# Patient Record
Sex: Male | Born: 1980 | Race: Black or African American | Hispanic: No | Marital: Single | Smoking: Former smoker
Health system: Southern US, Community
[De-identification: ages and names within clinical notes are randomized; demographics above are authoritative.]

## PROBLEM LIST (undated history)

## (undated) HISTORY — PX: NO PAST SURGERIES: SHX2092

---

## 2005-03-06 ENCOUNTER — Emergency Department: Payer: Self-pay | Admitting: Emergency Medicine

## 2010-04-05 ENCOUNTER — Emergency Department: Payer: Self-pay | Admitting: Emergency Medicine

## 2013-02-12 ENCOUNTER — Emergency Department: Payer: Self-pay | Admitting: Emergency Medicine

## 2013-02-12 LAB — BASIC METABOLIC PANEL
BUN: 10 mg/dL (ref 7–18)
Calcium, Total: 8.9 mg/dL (ref 8.5–10.1)
Chloride: 100 mmol/L (ref 98–107)
Co2: 30 mmol/L (ref 21–32)
EGFR (African American): >60

## 2013-02-12 LAB — CBC
HGB: 15.5 g/dL (ref 13.0–18.0)
MCH: 31.1 pg (ref 26.0–34.0)
MCHC: 34.2 g/dL (ref 32.0–36.0)
MCV: 91 fL (ref 80–100)
Platelet: 234 10*3/uL (ref 150–440)
RBC: 4.99 10*6/uL (ref 4.40–5.90)

## 2013-02-12 LAB — TROPONIN I: Troponin-I: <0.02 ng/mL

## 2015-05-14 ENCOUNTER — Encounter: Payer: Self-pay | Admitting: Medical Oncology

## 2015-05-14 ENCOUNTER — Emergency Department
Admission: EM | Admit: 2015-05-14 | Discharge: 2015-05-14 | Disposition: A | Discharge status: Home / Self Care | Payer: Medicaid Other | Attending: Emergency Medicine | Admitting: Emergency Medicine

## 2015-05-14 ENCOUNTER — Emergency Department: Payer: Medicaid Other

## 2015-05-14 DIAGNOSIS — S8992XA Unspecified injury of left lower leg, initial encounter: Secondary | ICD-10-CM | POA: Diagnosis present

## 2015-05-14 DIAGNOSIS — S8002XA Contusion of left knee, initial encounter: Secondary | ICD-10-CM | POA: Diagnosis not present

## 2015-05-14 DIAGNOSIS — Y9389 Activity, other specified: Secondary | ICD-10-CM | POA: Insufficient documentation

## 2015-05-14 DIAGNOSIS — Y92410 Unspecified street and highway as the place of occurrence of the external cause: Secondary | ICD-10-CM | POA: Insufficient documentation

## 2015-05-14 DIAGNOSIS — Y998 Other external cause status: Secondary | ICD-10-CM | POA: Diagnosis not present

## 2015-05-14 DIAGNOSIS — S233XXA Sprain of ligaments of thoracic spine, initial encounter: Secondary | ICD-10-CM

## 2015-05-14 DIAGNOSIS — S239XXA Sprain of unspecified parts of thorax, initial encounter: Secondary | ICD-10-CM | POA: Diagnosis not present

## 2015-05-14 DIAGNOSIS — S29012A Strain of muscle and tendon of back wall of thorax, initial encounter: Secondary | ICD-10-CM | POA: Diagnosis not present

## 2015-05-14 DIAGNOSIS — S39012A Strain of muscle, fascia and tendon of lower back, initial encounter: Secondary | ICD-10-CM | POA: Insufficient documentation

## 2015-05-14 MED ORDER — ORPHENADRINE CITRATE 30 MG/ML IJ SOLN: 60.0000 mg | Freq: Two times a day (BID) | INTRAMUSCULAR | Status: DC

## 2015-05-14 MED ORDER — CYCLOBENZAPRINE HCL 10 MG PO TABS: 10.0000 mg | ORAL_TABLET | Freq: Three times a day (TID) | ORAL | Status: DC | PRN

## 2015-05-14 MED ORDER — KETOROLAC TROMETHAMINE 60 MG/2ML IM SOLN: 60.0000 mg | Freq: Once | INTRAMUSCULAR | Status: DC

## 2015-05-14 MED ORDER — IBUPROFEN 800 MG PO TABS: 800.0000 mg | ORAL_TABLET | Freq: Three times a day (TID) | ORAL | Status: DC | PRN
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
## 2016-04-03 ENCOUNTER — Emergency Department
Admission: EM | Admit: 2016-04-03 | Discharge: 2016-04-03 | Disposition: A | Discharge status: Home / Self Care | Payer: Medicaid Other | Attending: Emergency Medicine | Admitting: Emergency Medicine

## 2016-04-03 ENCOUNTER — Encounter: Payer: Self-pay | Admitting: Emergency Medicine

## 2016-04-03 ENCOUNTER — Emergency Department: Payer: Medicaid Other

## 2016-04-03 DIAGNOSIS — S6992XA Unspecified injury of left wrist, hand and finger(s), initial encounter: Secondary | ICD-10-CM | POA: Diagnosis present

## 2016-04-03 DIAGNOSIS — Y929 Unspecified place or not applicable: Secondary | ICD-10-CM | POA: Diagnosis not present

## 2016-04-03 DIAGNOSIS — W2105XA Struck by basketball, initial encounter: Secondary | ICD-10-CM | POA: Insufficient documentation

## 2016-04-03 DIAGNOSIS — J069 Acute upper respiratory infection, unspecified: Secondary | ICD-10-CM | POA: Diagnosis not present

## 2016-04-03 DIAGNOSIS — Y999 Unspecified external cause status: Secondary | ICD-10-CM | POA: Diagnosis not present

## 2016-04-03 DIAGNOSIS — Y9367 Activity, basketball: Secondary | ICD-10-CM | POA: Diagnosis not present

## 2016-04-03 DIAGNOSIS — S62609A Fracture of unspecified phalanx of unspecified finger, initial encounter for closed fracture: Secondary | ICD-10-CM

## 2016-04-03 DIAGNOSIS — S62637A Displaced fracture of distal phalanx of left little finger, initial encounter for closed fracture: Secondary | ICD-10-CM | POA: Insufficient documentation

## 2016-04-03 MED ORDER — GUAIFENESIN-CODEINE 100-10 MG/5ML PO SOLN
10.0000 mL | ORAL | Status: DC | PRN
Start: 2016-04-03 — End: 2019-04-05

## 2016-04-03 MED ORDER — IBUPROFEN 800 MG PO TABS: 800.0000 mg | ORAL_TABLET | Freq: Three times a day (TID) | ORAL | Status: DC | PRN

## 2016-04-03 MED ORDER — AZITHROMYCIN 250 MG PO TABS: ORAL_TABLET | ORAL | Status: DC

## 2016-04-03 MED ORDER — HYDROCODONE-ACETAMINOPHEN 5-325 MG PO TABS: 1.0000 | ORAL_TABLET | ORAL | Status: DC | PRN

## 2016-04-03 NOTE — ED Provider Notes (Signed)
[facility] Emergency Department Provider Note  ____________________________________________  Time seen: Approximately 7:13 AM  I have reviewed the triage vital signs and the nursing notes.   HISTORY  Chief Complaint Cough; Chills; and Hand Pain    HPI Wayne Sims is a 35 y.o. male presents for evaluation of cough 1 week and chills at nighttime. Patient additionally reports complaining of injuring his left finger about 2 weeks ago presents with deformity of the left finger. She reports playing basketball when he got injured with the finger.   History reviewed. No pertinent past medical history.  There are no active problems to display for this patient.   History reviewed. No pertinent past surgical history.  Current Outpatient Rx  Name  Route  Sig  Dispense  Refill  . azithromycin (ZITHROMAX Z-PAK) 250 MG tablet      Take 2 tablets (500 mg) on  Day 1,  followed by 1 tablet (250 mg) once daily on Days 2 through 5.   6 each   0   . cyclobenzaprine (FLEXERIL) 10 MG tablet   Oral   Take 1 tablet (10 mg total) by mouth every 8 (eight) hours as needed for muscle spasms (PRN pain. Do not drive or operate heavy machinery while taking as can cause drowsiness.).   12 tablet   0   . guaiFENesin-codeine 100-10 MG/5ML syrup   Oral   Take 10 mLs by mouth every 4 (four) hours as needed for cough.   180 mL   0   . HYDROcodone-acetaminophen (NORCO) 5-325 MG tablet   Oral   Take 1-2 tablets by mouth every 4 (four) hours as needed for moderate pain.   15 tablet   0   . ibuprofen (ADVIL,MOTRIN) 800 MG tablet   Oral   Take 1 tablet (800 mg total) by mouth every 8 (eight) hours as needed.   30 tablet   0     Allergies Review of patient's allergies indicates no known allergies.  No family history on file.  Social History Social History  Substance Use Topics  . Smoking status: Never Smoker   . Smokeless tobacco: None  . Alcohol Use: No     Review of Systems Constitutional: Positive fever/chills Eyes: No visual changes. ENT: Positive sore throat. Cardiovascular: Denies chest pain. Respiratory: Denies shortness of breath. Positive for cough and Gastrointestinal: No abdominal pain.  No nausea, no vomiting.  No diarrhea.  No constipation. Genitourinary: Negative for dysuria. Musculoskeletal: Positive for left fifth finger pain. Skin: Negative for rash. Neurological: Positive for headaches, negative for focal weakness or numbness.  10-point ROS otherwise negative.  ____________________________________________   PHYSICAL EXAM:  VITAL SIGNS: ED Triage Vitals  Enc Vitals Group     BP 04/03/16 0246 125/87 mmHg     Pulse Rate 04/03/16 0246 87     Resp 04/03/16 0246 20     Temp 04/03/16 0246 99.1 F (37.3 C)     Temp Source 04/03/16 0246 Oral     SpO2 04/03/16 0246 97 %     Weight 04/03/16 0246 292 lb (132.45 kg)     Height 04/03/16 0246  (1.905 m)     Head Cir --      Peak Flow --      Pain Score 04/03/16 0247 9     Pain Loc --      Pain Edu? --      Excl. in GC? --     Constitutional: Alert  and oriented. Well appearing and in no acute distress. Eyes: Conjunctivae are normal. PERRL. EOMI. Head: Atraumatic. Nose: Minimal congestion/rhinnorhea. Mouth/Throat: Mucous membranes are moist.  Oropharynx non-erythematous. Neck: No stridor. Full range of motion nontender  Cardiovascular: Normal rate, regular rhythm. Grossly normal heart sounds.  Good peripheral circulation. Respiratory: Normal respiratory effort.  No retractions. Lungs CTAB. Gastrointestinal: Soft and nontender. No distention. No abdominal bruits. No CVA tenderness. Musculoskeletal: Left finger with distal deformity noted point tenderness as well to PIP joint Neurologic:  Normal speech and language. No gross focal neurologic deficits are appreciated. No gait instability. Skin:  Skin is warm, dry and intact. No rash noted. Psychiatric: Mood and  affect are normal. Speech and behavior are normal.  ____________________________________________   LABS (all labs ordered are listed, but only abnormal results are displayed)  Labs Reviewed - No data to display ____________________________________________  EKG   ____________________________________________  RADIOLOGY  FINDINGS: There is a dorsal plate fracture of the distal phalanx of the left fifth finger with posterior displacement of the dorsal plate fracture fragment and mild radial angulation of the distal fracture fragment. Proximal and medial phalanges appear intact. Soft tissues are unremarkable.  IMPRESSION: Displaced dorsal plate fracture of the distal phalanx of the left fifth finger ____________________________________________   PROCEDURES  Procedure(s) performed: None  Critical Care performed: No  ____________________________________________   INITIAL IMPRESSION / ASSESSMENT AND PLAN / ED COURSE  Pertinent labs & imaging results that were available during my care of the patient were reviewed by me and considered in my medical decision making (see chart for details).  Acute upper respiratory infection and left fifth finger fracture with mild displacement. 3 splint applied patient was discharged home with prescription for Zithromax, Robitussin-AC, Vicodin and Motrin 800 mg. Patient is a follow-up with orthopedics on-call and return to the ER with any worsening symptomology for his no other emergency medical complaints at this time. ____________________________________________   FINAL CLINICAL IMPRESSION(S) / ED DIAGNOSES  Final diagnoses:  URI, acute  Finger fracture, closed, initial encounter     This chart was dictated using voice recognition software/Dragon. Despite best efforts to proofread, errors can occur which can change the meaning. Any change was purely unintentional.   Evangeline Dakinharles M Jaron Czarnecki, PA-C 04/03/16 0802  Arnaldo NatalPaul F Malinda,  MD 04/03/16 1110

## 2016-04-03 NOTE — ED Notes (Signed)
Patient with complaint of cough times one week and chills at night. Patient states that he also injured his left fifth finger about 2 weeks ago. Patient with deformity and pain to finger.

## 2016-04-03 NOTE — Discharge Instructions (Signed)
Finger Fracture Fractures of fingers are breaks in the bones of the fingers. There are many types of fractures. There are different ways of treating these fractures. Your health care provider will discuss the best way to treat your fracture. CAUSES Traumatic injury is the main cause of broken fingers. These include:  Injuries while playing sports.  Workplace injuries.  Falls. RISK FACTORS Activities that can increase your risk of finger fractures include:  Sports.  Workplace activities that involve machinery.  A condition called osteoporosis, which can make your bones less dense and cause them to fracture more easily. SIGNS AND SYMPTOMS The main symptoms of a broken finger are pain and swelling within 15 minutes after the injury. Other symptoms include:  Bruising of your finger.  Stiffness of your finger.  Numbness of your finger.  Exposed bones (compound fracture) if the fracture is severe. DIAGNOSIS  The best way to diagnose a broken bone is with X-ray imaging. Additionally, your health care provider will use this X-ray image to evaluate the position of the broken finger bones.  TREATMENT  Finger fractures can be treated with:   Nonreduction--This means the bones are in place. The finger is splinted without changing the positions of the bone pieces. The splint is usually left on for about a week to 10 days. This will depend on your fracture and what your health care provider thinks.  Closed reduction--The bones are put back into position without using surgery. The finger is then splinted.  Open reduction and internal fixation--The fracture site is opened. Then the bone pieces are fixed into place with pins or some type of hardware. This is seldom required. It depends on the severity of the fracture. HOME CARE INSTRUCTIONS   Follow your health care provider's instructions regarding activities, exercises, and physical therapy.  Only take over-the-counter or prescription  medicines for pain, discomfort, or fever as directed by your health care provider. SEEK MEDICAL CARE IF: You have pain or swelling that limits the motion or use of your fingers. SEEK IMMEDIATE MEDICAL CARE IF:  Your finger becomes numb. MAKE SURE YOU:   Understand these instructions.  Will watch your condition.  Will get help right away if you are not doing well or get worse.   This information is not intended to replace advice given to you by your health care provider. Make sure you discuss any questions you have with your health care provider.   Document Released: 02/13/2001 Document Revised: 08/22/2013 Document Reviewed: 06/13/2013 Elsevier Interactive Patient Education 2016 [employer]  Upper Respiratory Infection, Adult Most upper respiratory infections (URIs) are a viral infection of the air passages leading to the lungs. A URI affects the nose, throat, and upper air passages. The most common type of URI is nasopharyngitis and is typically referred to as "the common cold." URIs run their course and usually go away on their own. Most of the time, a URI does not require medical attention, but sometimes a bacterial infection in the upper airways can follow a viral infection. This is called a secondary infection. Sinus and middle ear infections are common types of secondary upper respiratory infections. Bacterial pneumonia can also complicate a URI. A URI can worsen asthma and chronic obstructive pulmonary disease (COPD). Sometimes, these complications can require emergency medical care and may be life threatening.  CAUSES Almost all URIs are caused by viruses. A virus is a type of germ and can spread from one person to another.  RISKS FACTORS You may be at  risk for a URI if:   You smoke.   You have chronic heart or lung disease.  You have a weakened defense (immune) system.   You are very young or very old.   You have nasal allergies or asthma.  You work in crowded or  poorly ventilated areas.  You work in health care facilities or schools. SIGNS AND SYMPTOMS  Symptoms typically develop 2-3 days after you come in contact with a cold virus. Most viral URIs last 7-10 days. However, viral URIs from the influenza virus (flu virus) can last 14-18 days and are typically more severe. Symptoms may include:   Runny or stuffy (congested) nose.   Sneezing.   Cough.   Sore throat.   Headache.   Fatigue.   Fever.   Loss of appetite.   Pain in your forehead, behind your eyes, and over your cheekbones (sinus pain).  Muscle aches.  DIAGNOSIS  Your health care provider may diagnose a URI by:  Physical exam.  Tests to check that your symptoms are not due to another condition such as:  Strep throat.  Sinusitis.  Pneumonia.  Asthma. TREATMENT  A URI goes away on its own with time. It cannot be cured with medicines, but medicines may be prescribed or recommended to relieve symptoms. Medicines may help:  Reduce your fever.  Reduce your cough.  Relieve nasal congestion. HOME CARE INSTRUCTIONS   Take medicines only as directed by your health care provider.   Gargle warm saltwater or take cough drops to comfort your throat as directed by your health care provider.  Use a warm mist humidifier or inhale steam from a shower to increase air moisture. This may make it easier to breathe.  Drink enough fluid to keep your urine clear or pale yellow.   Eat soups and other clear broths and maintain good nutrition.   Rest as needed.   Return to work when your temperature has returned to normal or as your health care provider advises. You may need to stay home longer to avoid infecting others. You can also use a face mask and careful hand washing to prevent spread of the virus.  Increase the usage of your inhaler if you have asthma.   Do not use any tobacco products, including cigarettes, chewing tobacco, or electronic cigarettes. If you  need help quitting, ask your health care provider. PREVENTION  The best way to protect yourself from getting a cold is to practice good hygiene.   Avoid oral or hand contact with people with cold symptoms.   Wash your hands often if contact occurs.  There is no clear evidence that vitamin C, vitamin E, echinacea, or exercise reduces the chance of developing a cold. However, it is always recommended to get plenty of rest, exercise, and practice good nutrition.  SEEK MEDICAL CARE IF:   You are getting worse rather than better.   Your symptoms are not controlled by medicine.   You have chills.  You have worsening shortness of breath.  You have brown or red mucus.  You have yellow or brown nasal discharge.  You have pain in your face, especially when you bend forward.  You have a fever.  You have swollen neck glands.  You have pain while swallowing.  You have white areas in the back of your throat. SEEK IMMEDIATE MEDICAL CARE IF:   You have severe or persistent:  Headache.  Ear pain.  Sinus pain.  Chest pain.  You have chronic lung  disease and any of the following:  Wheezing.  Prolonged cough.  Coughing up blood.  A change in your usual mucus.  You have a stiff neck.  You have changes in your:  Vision.  Hearing.  Thinking.  Mood. MAKE SURE YOU:   Understand these instructions.  Will watch your condition.  Will get help right away if you are not doing well or get worse.   This information is not intended to replace advice given to you by your health care provider. Make sure you discuss any questions you have with your health care provider.   Document Released: 04/27/2001 Document Revised: 03/18/2015 Document Reviewed: 02/06/2014 Elsevier Interactive Patient Education [employer].

## 2017-02-25 ENCOUNTER — Emergency Department
Admission: EM | Admit: 2017-02-25 | Discharge: 2017-02-25 | Disposition: A | Discharge status: Home / Self Care | Payer: Medicaid Other | Attending: Emergency Medicine | Admitting: Emergency Medicine

## 2017-02-25 ENCOUNTER — Encounter: Payer: Self-pay | Admitting: Medical Oncology

## 2017-02-25 DIAGNOSIS — Z79899 Other long term (current) drug therapy: Secondary | ICD-10-CM | POA: Insufficient documentation

## 2017-02-25 DIAGNOSIS — J020 Streptococcal pharyngitis: Secondary | ICD-10-CM | POA: Insufficient documentation

## 2017-02-25 DIAGNOSIS — J029 Acute pharyngitis, unspecified: Secondary | ICD-10-CM | POA: Diagnosis present

## 2017-02-25 DIAGNOSIS — R509 Fever, unspecified: Secondary | ICD-10-CM

## 2017-02-25 LAB — POCT RAPID STREP A: STREPTOCOCCUS, GROUP A SCREEN (DIRECT): NEGATIVE

## 2017-02-25 MED ORDER — IBUPROFEN 800 MG PO TABS
800.0000 mg | ORAL_TABLET | Freq: Once | ORAL | Status: AC
  Administered 2017-02-25: 800 mg via ORAL
  Filled 2017-02-25: qty 1

## 2017-02-25 MED ORDER — AMOXICILLIN 875 MG PO TABS: 875.0000 mg | ORAL_TABLET | Freq: Two times a day (BID) | ORAL | 0 refills | Status: DC

## 2017-02-25 NOTE — ED Triage Notes (Signed)
Pt reports sore throat for a few days with low grade fever.

## 2017-02-25 NOTE — Discharge Instructions (Signed)
Please make sure your taking lots of fluids. Take Tylenol and ibuprofen as needed for pain and fevers. Return to the ER for any difficulty swallowing, there is a 102 that are not going down Tylenol and ibuprofen. Please antibiotics.

## 2017-02-25 NOTE — ED Provider Notes (Signed)
[facility]-EMERGENCY DEPARTMENT Provider Note   CSN: [id number] Arrival date & time: 02/25/17  1754     History   Chief Complaint Chief Complaint  Patient presents with  . Sore Throat    HPI Wayne Sims is a 36 y.o. male presents to the emergency department for evaluation of sore throat. Sore throat has been present for 3 days. Sudden onset of pain. He has pain with swallowing. Denies any cough congestion or runny nose. He has had a low-grade fever. Tolerating fluids well. He is not any Tylenol or ibuprofen for pain. Pain is moderate. He denies any rashes, abdominal pain, vomiting.  HPI  History reviewed. No pertinent past medical history.  There are no active problems to display for this patient.   History reviewed. No pertinent surgical history.     Home Medications    Prior to Admission medications   Medication Sig Start Date End Date Taking? Authorizing Provider  amoxicillin (AMOXIL) 875 MG tablet Take 1 tablet (875 mg total) by mouth 2 (two) times daily. X 10 days 02/25/17   Evon Slack, PA-C  azithromycin (ZITHROMAX Z-PAK) 250 MG tablet Take 2 tablets (500 mg) on  Day 1,  followed by 1 tablet (250 mg) once daily on Days 2 through 5. 04/03/16   Evangeline Dakin, PA-C  cyclobenzaprine (FLEXERIL) 10 MG tablet Take 1 tablet (10 mg total) by mouth every 8 (eight) hours as needed for muscle spasms (PRN pain. Do not drive or operate heavy machinery while taking as can cause drowsiness.). 05/14/15   Renford Dills, NP  guaiFENesin-codeine 100-10 MG/5ML syrup Take 10 mLs by mouth every 4 (four) hours as needed for cough. 04/03/16   Evangeline Dakin, PA-C  HYDROcodone-acetaminophen (NORCO) 5-325 MG tablet Take 1-2 tablets by mouth every 4 (four) hours as needed for moderate pain. 04/03/16   Charmayne Sheer Beers, PA-C  ibuprofen (ADVIL,MOTRIN) 800 MG tablet Take 1 tablet (800 mg total) by mouth every 8 (eight) hours as needed. 04/03/16   Evangeline Dakin, PA-C    Family History No family  history on file.  Social History Social History  Substance Use Topics  . Smoking status: Never Smoker  . Smokeless tobacco: Not on file  . Alcohol use No     Allergies   Patient has no known allergies.   Review of Systems Review of Systems  Constitutional: Positive for fever. Negative for activity change, appetite change and chills.  HENT: Positive for sore throat. Negative for congestion, ear pain, mouth sores, rhinorrhea, sinus pressure and trouble swallowing.   Eyes: Negative for photophobia, pain and discharge.  Respiratory: Negative for cough, chest tightness and shortness of breath.   Cardiovascular: Negative for chest pain and leg swelling.  Gastrointestinal: Negative for abdominal distention, abdominal pain, diarrhea, nausea and vomiting.  Genitourinary: Negative for difficulty urinating and dysuria.  Musculoskeletal: Negative for arthralgias, back pain and gait problem.  Skin: Negative for color change and rash.  Neurological: Negative for dizziness and headaches.  Hematological: Negative for adenopathy.  Psychiatric/Behavioral: Negative for agitation and behavioral problems.     Physical Exam Updated Vital Signs BP 132/83 (BP Location: Right Arm)   Pulse 84   Temp 99.1 F (37.3 C) (Oral)   Resp 17   Ht  (1.93 m)   Wt 133.8 kg   SpO2 98%   BMI 35.91 kg/m   Physical Exam  Constitutional: He is oriented to person, place, and time. He appears well-developed and well-nourished.  HENT:  Head: Normocephalic and atraumatic.  Left Ear: External ear normal.  Mouth/Throat: No trismus in the jaw. No uvula swelling. Oropharyngeal exudate and posterior oropharyngeal erythema present. No posterior oropharyngeal edema or tonsillar abscesses.  Eyes: Conjunctivae and EOM are normal. Pupils are equal, round, and reactive to light.  Neck: Normal range of motion. Neck supple.  Cardiovascular: Normal rate, regular rhythm, normal heart sounds and intact distal pulses.     Pulmonary/Chest: Effort normal and breath sounds normal. No respiratory distress. He has no wheezes. He has no rales. He exhibits no tenderness.  Abdominal: Soft. Bowel sounds are normal. He exhibits no distension and no mass. There is no tenderness. There is no guarding.  Musculoskeletal: Normal range of motion. He exhibits no edema or tenderness.  Lymphadenopathy:    He has cervical adenopathy (tender anterior cervical).  Neurological: He is alert and oriented to person, place, and time.  Skin: Skin is warm and dry.  Psychiatric: He has a normal mood and affect. His behavior is normal. Judgment and thought content normal.     ED Treatments / Results  Labs (all labs ordered are listed, but only abnormal results are displayed) Labs Reviewed  POCT RAPID STREP A    EKG  EKG Interpretation None       Radiology No results found.  Procedures Procedures (including critical care time)  Medications Ordered in ED Medications  ibuprofen (ADVIL,MOTRIN) tablet 800 mg (not administered)     Initial Impression / Assessment and Plan / ED Course  I have reviewed the triage vital signs and the nursing notes.  Pertinent labs & imaging results that were available during my care of the patient were reviewed by me and considered in my medical decision making (see chart for details).     36 year old male with moderate sore throat, low-grade fever. No cough. Tender anterior cervical lymphadenopathy. On exam he has exudates and erythema with no signs of peritonsillar abscess. Patient is started on amoxicillin. He will increase fluids. He is educated on signs and symptoms return to the ED for.  Final Clinical Impressions(s) / ED Diagnoses   Final diagnoses:  Acute streptococcal pharyngitis  Fever, unspecified fever cause    New Prescriptions New Prescriptions   AMOXICILLIN (AMOXIL) 875 MG TABLET    Take 1 tablet (875 mg total) by mouth 2 (two) times daily. X 10 days     Evon Slack, PA-C 02/25/17 1850    Merrily Brittle, MD 02/25/17 2126

## 2017-02-27 ENCOUNTER — Emergency Department
Admission: EM | Admit: 2017-02-27 | Discharge: 2017-02-27 | Disposition: A | Discharge status: Home / Self Care | Payer: Medicaid Other | Attending: Emergency Medicine | Admitting: Emergency Medicine

## 2017-02-27 ENCOUNTER — Encounter: Payer: Self-pay | Admitting: Emergency Medicine

## 2017-02-27 DIAGNOSIS — Z79899 Other long term (current) drug therapy: Secondary | ICD-10-CM | POA: Insufficient documentation

## 2017-02-27 DIAGNOSIS — J029 Acute pharyngitis, unspecified: Secondary | ICD-10-CM | POA: Diagnosis present

## 2017-02-27 MED ORDER — DEXAMETHASONE 6 MG PO TABS
10.0000 mg | ORAL_TABLET | Freq: Once | ORAL | Status: DC
  Filled 2017-02-27: qty 1

## 2017-02-27 MED ORDER — IBUPROFEN 600 MG PO TABS
600.0000 mg | ORAL_TABLET | Freq: Once | ORAL | Status: AC
  Administered 2017-02-27: 600 mg via ORAL
  Filled 2017-02-27: qty 1

## 2017-02-27 MED ORDER — DEXAMETHASONE SODIUM PHOSPHATE 10 MG/ML IJ SOLN
10.0000 mg | Freq: Once | INTRAMUSCULAR | Status: AC
  Administered 2017-02-27: 10 mg

## 2017-02-27 MED ORDER — IBUPROFEN 600 MG PO TABS: ORAL_TABLET | ORAL | 0 refills | Status: DC

## 2017-02-27 MED ORDER — DEXAMETHASONE SODIUM PHOSPHATE 10 MG/ML IJ SOLN
INTRAMUSCULAR | Status: AC
  Filled 2017-02-27: qty 1

## 2017-02-27 NOTE — ED Notes (Signed)
Pt states he thinks he has tonsil stones. Pt with white areas, one on each tonsil noted. They are small approx less than 1mm and pearl like. Pt states pain with swallowing. Pt denies fever.

## 2017-02-27 NOTE — ED Triage Notes (Signed)
Patient with complaint of sore throat times 4 days. Patient states that he was seen here yesterday for the same and started on amoxicillin. Patient reports that the pain has become worse today.

## 2017-02-27 NOTE — ED Provider Notes (Signed)
[facility] Emergency Department Provider Note  ____________________________________________   First MD Initiated Contact with Patient 02/27/17 [phone]     (approximate)  I have reviewed the triage vital signs and the nursing notes.   HISTORY  Chief Complaint Sore Throat    HPI Wayne Sims is a 36 y.o. male For reevaluation of sore throat.  He was seen yesterday in [facility] and diagnosed with pharyngitis and started on amoxicillin.  He has had a couple of doses of antibiotics but states that his throat feels worse.  He feels very worn out and tired and does not want to eat or drink anything because of the pain.  Ibuprofen makes it a little bit better but he states he was not given a prescription for ibuprofen.  Eating and drinking makes it worse.  He has not had any change of voice.  He has not had fever or chills today.  He denies nausea, vomiting, chest pain, shortness of breath, abdominal pain, dysuria.  He describes the symptoms as severe and they have been present for about 4 days and gradually worsening over that time.    History reviewed. No pertinent past medical history.  There are no active problems to display for this patient.   History reviewed. No pertinent surgical history.  Prior to Admission medications   Medication Sig Start Date End Date Taking? Authorizing Provider  amoxicillin (AMOXIL) 875 MG tablet Take 1 tablet (875 mg total) by mouth 2 (two) times daily. X 10 days 02/25/17   Evon Slack, PA-C  azithromycin (ZITHROMAX Z-PAK) 250 MG tablet Take 2 tablets (500 mg) on  Day 1,  followed by 1 tablet (250 mg) once daily on Days 2 through 5. 04/03/16   Evangeline Dakin, PA-C  cyclobenzaprine (FLEXERIL) 10 MG tablet Take 1 tablet (10 mg total) by mouth every 8 (eight) hours as needed for muscle spasms (PRN pain. Do not drive or operate heavy machinery while taking as can cause drowsiness.). 05/14/15   Renford Dills, NP  guaiFENesin-codeine  100-10 MG/5ML syrup Take 10 mLs by mouth every 4 (four) hours as needed for cough. 04/03/16   Evangeline Dakin, PA-C  HYDROcodone-acetaminophen (NORCO) 5-325 MG tablet Take 1-2 tablets by mouth every 4 (four) hours as needed for moderate pain. 04/03/16   Evangeline Dakin, PA-C  ibuprofen (ADVIL,MOTRIN) 600 MG tablet Take 1 tablet by mouth three times daily with meals 02/27/17   Loleta Rose, MD    Allergies Patient has no known allergies.  No family history on file.  Social History Social History  Substance Use Topics  . Smoking status: Never Smoker  . Smokeless tobacco: Never Used  . Alcohol use No    Review of Systems Constitutional: No fever/chills Eyes: No visual changes. ENT: +sore throat. Cardiovascular: Denies chest pain. Respiratory: Denies shortness of breath. Gastrointestinal: No abdominal pain.  No nausea, no vomiting.  No diarrhea.  No constipation. Genitourinary: Negative for dysuria. Musculoskeletal: Negative for back pain. Skin: Negative for rash. Neurological: Negative for headaches, focal weakness or numbness.  10-point ROS otherwise negative.  ____________________________________________   PHYSICAL EXAM:  VITAL SIGNS: ED Triage Vitals  Enc Vitals Group     BP 02/27/17 0225 (!) 141/82     Pulse Rate 02/27/17 0225 85     Resp 02/27/17 0225 18     Temp 02/27/17 0225 99.1 F (37.3 C)     Temp Source 02/27/17 0225 Oral     SpO2 02/27/17 0225  97 %     Weight 02/27/17 0219 295 lb (133.8 kg)     Height 02/27/17 0219  (1.93 m)     Head Circumference --      Peak Flow --      Pain Score 02/27/17 0219 10     Pain Loc --      Pain Edu? --      Excl. in GC? --     Constitutional: Alert and oriented. Well appearing and in no acute distress. Eyes: Conjunctivae are normal. PERRL. EOMI. Head: Atraumatic. Ears:  Healthy appearing ear canals and TMs bilaterally Nose: No congestion/rhinnorhea. Mouth/Throat: Mucous membranes are moist.  Oropharynx  erythematous with tonsillar/oropharyngeal exudate. No oropharyngeal edema.  No evidence of peritonsillar abscess. Neck: No stridor.  No meningeal signs.  No cervical spine tenderness to palpation.   Cardiovascular: Normal rate, regular rhythm. Good peripheral circulation.  Respiratory: Normal respiratory effort.  No retractions. Lungs CTAB. Skin:  Skin is warm, dry and intact. No rash noted. Psychiatric: Mood and affect are normal. Speech and behavior are normal.  ____________________________________________   LABS (all labs ordered are listed, but only abnormal results are displayed)  Labs Reviewed - No data to display ____________________________________________  EKG  None - EKG not ordered by ED physician ____________________________________________  RADIOLOGY   No results found.  ____________________________________________   PROCEDURES  Critical Care performed: No   Procedure(s) performed:   Procedures   ____________________________________________   INITIAL IMPRESSION / ASSESSMENT AND PLAN / ED COURSE  Pertinent labs & imaging results that were available during my care of the patient were reviewed by me and considered in my medical decision making (see chart for details).  The patient is well-appearing and in no acute distress with normal vital signs.  His exam is reassuring with no evidence of a deep neck infection or other acute or emergent medical condition.  I provided reassurance that he will improve on antibiotics but he is insistent that his throat is extremely sore making it difficult for him to swallow his medicines or eat or drink.  He has noted change of voice and no difficulty swallowing, just pain when he does so.  Given his concerns for the pain, I will give him a one-time dose of Decadron 10 mg by mouth to help with the swelling of the pain while the antibiotic to work.  I stressed that it is absolutely critical that he finish his full course of  treatment of antibiotics.  I gave him outpatient follow-up information.  I also gave my usual and customary return precautions.  He understands and agrees with the plan.  I explained that he can use regular over-the-counter ibuprofen but he feels more comfortable with a prescription.      ____________________________________________  FINAL CLINICAL IMPRESSION(S) / ED DIAGNOSES  Final diagnoses:  Acute pharyngitis, unspecified etiology     MEDICATIONS GIVEN DURING THIS VISIT:  Medications  ibuprofen (ADVIL,MOTRIN) tablet 600 mg (600 mg Oral Given 02/27/17 0615)  dexamethasone (DECADRON) injection 10 mg (10 mg Other Given 02/27/17 0616)     NEW OUTPATIENT MEDICATIONS STARTED DURING THIS VISIT:  New Prescriptions   IBUPROFEN (ADVIL,MOTRIN) 600 MG TABLET    Take 1 tablet by mouth three times daily with meals    Modified Medications   No medications on file    Discontinued Medications   IBUPROFEN (ADVIL,MOTRIN) 800 MG TABLET    Take 1 tablet (800 mg total) by mouth every 8 (eight) hours as needed.  Note:  This document was prepared using Dragon voice recognition software and may include unintentional dictation errors.    Loleta Rose, MD 02/27/17 [phone]

## 2017-02-27 NOTE — ED Notes (Signed)
Pt states he was seen in ed yesterday with sore throat. Pt states the "doctor really didn't look down in there because I was coughing." pt states "I have tonsil stones, I saw it on the internet."

## 2019-01-07 ENCOUNTER — Other Ambulatory Visit: Payer: Self-pay

## 2019-01-07 ENCOUNTER — Emergency Department: Payer: Self-pay

## 2019-01-07 ENCOUNTER — Emergency Department
Admission: EM | Admit: 2019-01-07 | Discharge: 2019-01-07 | Disposition: A | Discharge status: Home / Self Care | Payer: Self-pay | Attending: Emergency Medicine | Admitting: Emergency Medicine

## 2019-01-07 DIAGNOSIS — J111 Influenza due to unidentified influenza virus with other respiratory manifestations: Secondary | ICD-10-CM | POA: Insufficient documentation

## 2019-01-07 DIAGNOSIS — J101 Influenza due to other identified influenza virus with other respiratory manifestations: Secondary | ICD-10-CM

## 2019-01-07 LAB — BASIC METABOLIC PANEL
Anion gap: 8 (ref 5–15)
BUN: 9 mg/dL (ref 6–20)
CHLORIDE: 100 mmol/L (ref 98–111)
CO2: 26 mmol/L (ref 22–32)
Calcium: 8.5 mg/dL — ABNORMAL LOW (ref 8.9–10.3)
Creatinine, Ser: 1.74 mg/dL — ABNORMAL HIGH (ref 0.61–1.24)
GFR calc Af Amer: 57 mL/min — ABNORMAL LOW (ref >60)
GFR calc non Af Amer: 49 mL/min — ABNORMAL LOW (ref >60)
Glucose, Bld: 120 mg/dL — ABNORMAL HIGH (ref 70–99)
Potassium: 3.8 mmol/L (ref 3.5–5.1)
Sodium: 134 mmol/L — ABNORMAL LOW (ref 135–145)

## 2019-01-07 LAB — CBC
HEMATOCRIT: 44.9 % (ref 39.0–52.0)
Hemoglobin: 14.8 g/dL (ref 13.0–17.0)
MCH: 30.5 pg (ref 26.0–34.0)
MCHC: 33 g/dL (ref 30.0–36.0)
MCV: 92.4 fL (ref 80.0–100.0)
Platelets: 163 10*3/uL (ref 150–400)
RBC: 4.86 MIL/uL (ref 4.22–5.81)
RDW: 12.2 % (ref 11.5–15.5)
WBC: 9.8 10*3/uL (ref 4.0–10.5)
nRBC: 0 % (ref 0.0–0.2)

## 2019-01-07 LAB — INFLUENZA PANEL BY PCR (TYPE A & B)
Influenza A By PCR: NEGATIVE
Influenza B By PCR: POSITIVE — AB

## 2019-01-07 LAB — GROUP A STREP BY PCR: Group A Strep by PCR: NOT DETECTED

## 2019-01-07 LAB — MONONUCLEOSIS SCREEN: MONO SCREEN: NEGATIVE

## 2019-01-07 MED ORDER — IBUPROFEN 600 MG PO TABS: 600.0000 mg | ORAL_TABLET | Freq: Four times a day (QID) | ORAL | 0 refills | Status: DC | PRN

## 2019-01-07 MED ORDER — SODIUM CHLORIDE 0.9% FLUSH: 3.0000 mL | Freq: Once | INTRAVENOUS | Status: DC

## 2019-01-07 MED ORDER — DEXAMETHASONE SODIUM PHOSPHATE 10 MG/ML IJ SOLN: 10.0000 mg | Freq: Once | INTRAMUSCULAR | Status: DC

## 2019-01-07 MED ORDER — ACETAMINOPHEN 500 MG PO TABS
1000.0000 mg | ORAL_TABLET | Freq: Once | ORAL | Status: AC
  Administered 2019-01-07: 1000 mg via ORAL
  Filled 2019-01-07: qty 2

## 2019-01-07 MED ORDER — ONDANSETRON 4 MG PO TBDP: 4.0000 mg | ORAL_TABLET | Freq: Three times a day (TID) | ORAL | 0 refills | Status: DC | PRN

## 2019-01-07 MED ORDER — LIDOCAINE VISCOUS HCL 2 % MT SOLN
15.0000 mL | Freq: Once | OROMUCOSAL | Status: AC
  Administered 2019-01-07: 15 mL via OROMUCOSAL
  Filled 2019-01-07: qty 15

## 2019-01-07 MED ORDER — KETOROLAC TROMETHAMINE 30 MG/ML IJ SOLN
15.0000 mg | Freq: Once | INTRAMUSCULAR | Status: AC
  Administered 2019-01-07: 15 mg via INTRAVENOUS
  Filled 2019-01-07: qty 1

## 2019-01-07 NOTE — ED Notes (Signed)
RN Jae Dire aware of pt in room.

## 2019-01-07 NOTE — ED Triage Notes (Addendum)
Pt comes via POV from home with c/o sore throat and dizziness. Pt states this all started about 4 days ago.  Pt states fever and no relief with OTC medications, productive cough.  Pt seems very uncomfortable. Pt states he feels he is going to pass out. Pt has fever on arrival.

## 2019-01-07 NOTE — ED Provider Notes (Addendum)
[facility] Emergency Department Provider Note  ____________________________________________  Time seen: Approximately 8:15 AM  I have reviewed the triage vital signs and the nursing notes.   HISTORY  Chief Complaint Sore Throat and Dizziness   HPI Wayne Sims is a 38 y.o. male with no significant past medical history who presents for evaluation of sore throat.  Patient is complaining of 4 days of sore throat.  His symptoms have been getting progressively worse.  Today started feeling dizzy like he was going to pass out this morning which prompted his visit to the ER.  Has not checked his temperature at home.  No cough or congestion, no chest pain or shortness of breath, no nausea, vomiting, diarrhea.  No abdominal pain.  PMH None - reviewed  Prior to Admission medications   Medication Sig Start Date End Date Taking? Authorizing Provider  amoxicillin (AMOXIL) 875 MG tablet Take 1 tablet (875 mg total) by mouth 2 (two) times daily. X 10 days 02/25/17   Evon Slack, PA-C  azithromycin (ZITHROMAX Z-PAK) 250 MG tablet Take 2 tablets (500 mg) on  Day 1,  followed by 1 tablet (250 mg) once daily on Days 2 through 5. 04/03/16   Beers, Charmayne Sheer, PA-C  cyclobenzaprine (FLEXERIL) 10 MG tablet Take 1 tablet (10 mg total) by mouth every 8 (eight) hours as needed for muscle spasms (PRN pain. Do not drive or operate heavy machinery while taking as can cause drowsiness.). 05/14/15   Renford Dills, NP  guaiFENesin-codeine 100-10 MG/5ML syrup Take 10 mLs by mouth every 4 (four) hours as needed for cough. 04/03/16   Beers, Charmayne Sheer, PA-C  HYDROcodone-acetaminophen (NORCO) 5-325 MG tablet Take 1-2 tablets by mouth every 4 (four) hours as needed for moderate pain. 04/03/16   Beers, Charmayne Sheer, PA-C  ibuprofen (ADVIL,MOTRIN) 600 MG tablet Take 1 tablet (600 mg total) by mouth every 6 (six) hours as needed. 01/07/19   Nita Sickle, MD  ondansetron (ZOFRAN ODT) 4 MG  disintegrating tablet Take 1 tablet (4 mg total) by mouth every 8 (eight) hours as needed. 01/07/19   Nita Sickle, MD    Allergies Patient has no known allergies.  No family history on file.  Social History Social History   Tobacco Use  . Smoking status: Never Smoker  . Smokeless tobacco: Never Used  Substance Use Topics  . Alcohol use: No  . Drug use: Not on file    Review of Systems  Constitutional: + fever. Eyes: Negative for visual changes. ENT: + sore throat. Neck: No neck pain  Cardiovascular: Negative for chest pain. Respiratory: Negative for shortness of breath. Gastrointestinal: Negative for abdominal pain, vomiting or diarrhea. Genitourinary: Negative for dysuria. Musculoskeletal: Negative for back pain. Skin: Negative for rash. Neurological: Negative for headaches, weakness or numbness. Psych: No SI or HI  ____________________________________________   PHYSICAL EXAM:  VITAL SIGNS: ED Triage Vitals  Enc Vitals Group     BP 01/07/19 0748 127/69     Pulse Rate 01/07/19 0748 (!) 112     Resp 01/07/19 0748 18     Temp 01/07/19 0748 (!) 103.2 F (39.6 C)     Temp Source 01/07/19 0748 Oral     SpO2 01/07/19 0748 97 %     Weight 01/07/19 0748 265 lb (120.2 kg)     Height 01/07/19 0748 6\' 3"  (1.905 m)     Head Circumference --      Peak Flow --  Pain Score 01/07/19 0754 0     Pain Loc --      Pain Edu? --      Excl. in GC? --     Constitutional: Alert and oriented. Well appearing and in no apparent distress. HEENT:      Head: Normocephalic and atraumatic.         Eyes: Conjunctivae are normal. Sclera is non-icteric.       Mouth/Throat: Mucous membranes are moist.  Oropharynx is clear with no exudates, tonsils are normal with no erythema or swelling, no peritonsillar abscess, uvula is midline with no swelling, patient is handling saliva with no difficulty      Neck: Supple with no signs of meningismus. No lymphadenopathy Cardiovascular:  Tachycardic with regular rhythm. No murmurs, gallops, or rubs. 2+ symmetrical distal pulses are present in all extremities. No JVD. Respiratory: Normal respiratory effort. Lungs are clear to auscultation bilaterally. No wheezes, crackles, or rhonchi.  Gastrointestinal: Soft, non tender, and non distended with positive bowel sounds. No rebound or guarding. Musculoskeletal: Nontender with normal range of motion in all extremities. No edema, cyanosis, or erythema of extremities. Neurologic: Normal speech and language. Face is symmetric. Moving all extremities. No gross focal neurologic deficits are appreciated. Skin: Skin is warm, dry and intact. No rash noted. Psychiatric: Mood and affect are normal. Speech and behavior are normal.  ____________________________________________   LABS (all labs ordered are listed, but only abnormal results are displayed)  Labs Reviewed  BASIC METABOLIC PANEL - Abnormal; Notable for the following components:      Result Value   Sodium 134 (*)    Glucose, Bld 120 (*)    Creatinine, Ser 1.74 (*)    Calcium 8.5 (*)    GFR calc non Af Amer 49 (*)    GFR calc Af Amer 57 (*)    All other components within normal limits  INFLUENZA PANEL BY PCR (TYPE A & B) - Abnormal; Notable for the following components:   Influenza B By PCR POSITIVE (*)    All other components within normal limits  GROUP A STREP BY PCR  CBC  MONONUCLEOSIS SCREEN  URINALYSIS, COMPLETE (UACMP) WITH MICROSCOPIC  CBC WITH DIFFERENTIAL/PLATELET  CBG MONITORING, ED   ____________________________________________  EKG  ED ECG REPORT I, Nita Sickle, the attending physician, personally viewed and interpreted this ECG.  Normal sinus rhythm, rate of 83, normal intervals, normal axis, no ST elevations or depressions normal EKG. ____________________________________________  RADIOLOGY  I have personally reviewed the images performed during this visit and I agree with the Radiologist's  read.   Interpretation by Radiologist:  Dg Neck Soft Tissue  Result Date: 01/07/2019 CLINICAL DATA:  Sore throat fever and cough for 4 days. EXAM: NECK SOFT TISSUES - 1+ VIEW COMPARISON:  None. FINDINGS: There is no evidence of retropharyngeal soft tissue swelling or epiglottic enlargement. The cervical airway is unremarkable and no radio-opaque foreign body identified. IMPRESSION: Negative. Electronically Signed   By: Ted Mcalpine M.D.   On: 01/07/2019 09:00      ____________________________________________   PROCEDURES  Procedure(s) performed: None Procedures Critical Care performed:  None ____________________________________________   INITIAL IMPRESSION / ASSESSMENT AND PLAN / ED COURSE  38 y.o. male with no significant past medical history who presents for evaluation of sore throat x 4 days and fever and dizziness starting this am.  Patient arrives with a fever of 103.2, slightly tachycardic, oropharynx is clear with no exudates, no swelling, no erythema, no peritonsillar abscess, no  neck lymphadenopathy, patient is breathing with no difficulty, handling his saliva.  Differential diagnosis including flu versus mono versus strep versus viral illness.  No evidence of Ludwig's angina.  Less likely epiglottitis or retropharyngeal abscess on a vaccinated patient and with no respiratory distress and no difficulty in handling his saliva.  we will get a neck x-ray, labs, strep/flu swabs, monospot, basic labs. Will give tylenol, toradol, viscous lido, and steroids    _________________________ 9:40 AM on 01/07/2019 -----------------------------------------  Influenza B positive.  Remainder of the labs are within normal limits.  Temp now 100.20F. Patient received IV fluids, Tylenol and Toradol with significant relief.  Will discharge home on supportive care and follow-up with primary care doctor.  Discussed and return precautions.   As part of my medical decision making, I reviewed the  following data within the electronic MEDICAL RECORD NUMBER Nursing notes reviewed and incorporated, Labs reviewed , EKG interpreted , Old chart reviewed, Radiograph reviewed , Notes from prior ED visits and [place] Controlled Substance Database    Pertinent labs & imaging results that were available during my care of the patient were reviewed by me and considered in my medical decision making (see chart for details).    ____________________________________________   FINAL CLINICAL IMPRESSION(S) / ED DIAGNOSES  Final diagnoses:  Influenza B      NEW MEDICATIONS STARTED DURING THIS VISIT:  ED Discharge Orders         Ordered    ibuprofen (ADVIL,MOTRIN) 600 MG tablet  Every 6 hours PRN     01/07/19 0940    ondansetron (ZOFRAN ODT) 4 MG disintegrating tablet  Every 8 hours PRN     01/07/19 0940           Note:  This document was prepared using Dragon voice recognition software and may include unintentional dictation errors.    Don Perking, [place], MD 01/07/19 4801    Nita Sickle, MD 01/07/19 [phone]

## 2019-01-07 NOTE — ED Notes (Signed)
Pt in scans, will administer medications when pt returns. Provided family member with water.

## 2019-04-05 ENCOUNTER — Encounter: Payer: Self-pay | Admitting: Emergency Medicine

## 2019-04-05 ENCOUNTER — Other Ambulatory Visit: Payer: Self-pay

## 2019-04-05 ENCOUNTER — Ambulatory Visit (INDEPENDENT_AMBULATORY_CARE_PROVIDER_SITE_OTHER): Payer: Self-pay

## 2019-04-05 ENCOUNTER — Ambulatory Visit
Admission: EM | Admit: 2019-04-05 | Discharge: 2019-04-05 | Disposition: A | Discharge status: Home / Self Care | Payer: Self-pay | Attending: Family Medicine | Admitting: Family Medicine

## 2019-04-05 DIAGNOSIS — M542 Cervicalgia: Secondary | ICD-10-CM

## 2019-04-05 DIAGNOSIS — M25562 Pain in left knee: Secondary | ICD-10-CM

## 2019-04-05 DIAGNOSIS — R519 Headache, unspecified: Secondary | ICD-10-CM

## 2019-04-05 DIAGNOSIS — R51 Headache: Secondary | ICD-10-CM

## 2019-04-05 MED ORDER — TIZANIDINE HCL 4 MG PO TABS: 4.0000 mg | ORAL_TABLET | Freq: Three times a day (TID) | ORAL | 0 refills | Status: AC | PRN

## 2019-04-05 MED ORDER — MELOXICAM 15 MG PO TABS: 15.0000 mg | ORAL_TABLET | Freq: Every day | ORAL | 0 refills | Status: AC | PRN
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
## 2019-04-05 NOTE — Discharge Instructions (Signed)
Xrays negative.  Medications as prescribed.  Take care  Dr. Adriana Simas
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
## 2019-06-13 ENCOUNTER — Other Ambulatory Visit: Payer: Self-pay

## 2019-06-13 DIAGNOSIS — Z20822 Contact with and (suspected) exposure to covid-19: Secondary | ICD-10-CM

## 2019-06-14 LAB — NOVEL CORONAVIRUS, NAA: SARS-CoV-2, NAA: NOT DETECTED

## 2019-09-07 ENCOUNTER — Other Ambulatory Visit: Payer: Self-pay

## 2019-09-07 DIAGNOSIS — Z20822 Contact with and (suspected) exposure to covid-19: Secondary | ICD-10-CM

## 2019-09-09 LAB — NOVEL CORONAVIRUS, NAA: SARS-CoV-2, NAA: NOT DETECTED

## 2019-09-10 ENCOUNTER — Telehealth: Payer: Self-pay | Admitting: General Practice

## 2019-09-10 NOTE — Telephone Encounter (Signed)
Negative COVID results given. Patient results "NOT Detected." Caller expressed understanding. ° °

## 2019-09-11 NOTE — Telephone Encounter (Signed)
Please fax results to pt's employer:  [employer] Fax: [phone]

## 2021-05-13 IMAGING — CR LEFT TIBIA AND FIBULA - 2 VIEW
4 series · 4 of 4 positions shown · non-contrast
Comparison: None.

CLINICAL DATA: Pain status post trauma

EXAM:
LEFT TIBIA AND FIBULA - 2 VIEW

[tibia ap (1 of 2)]
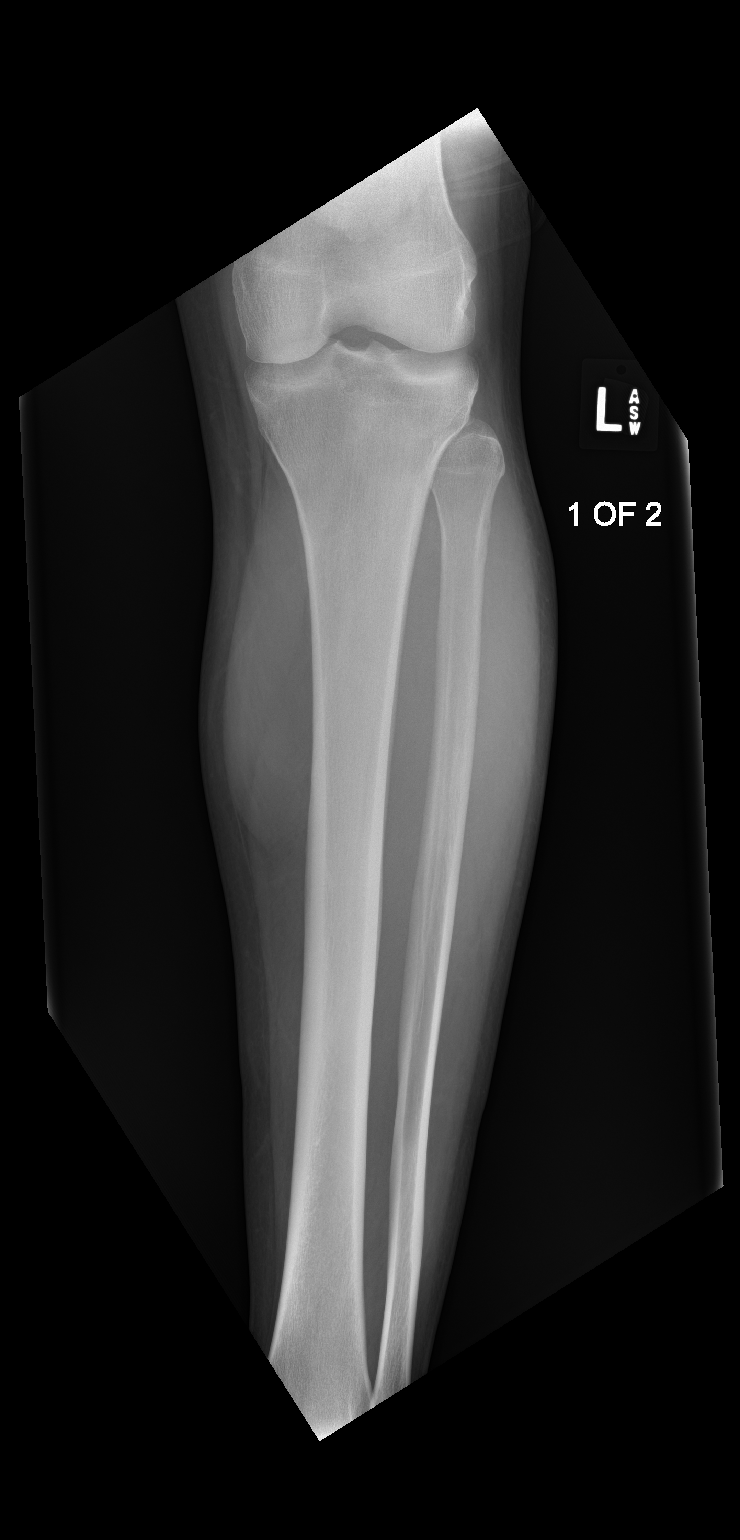

[tibia ap (2 of 2)]
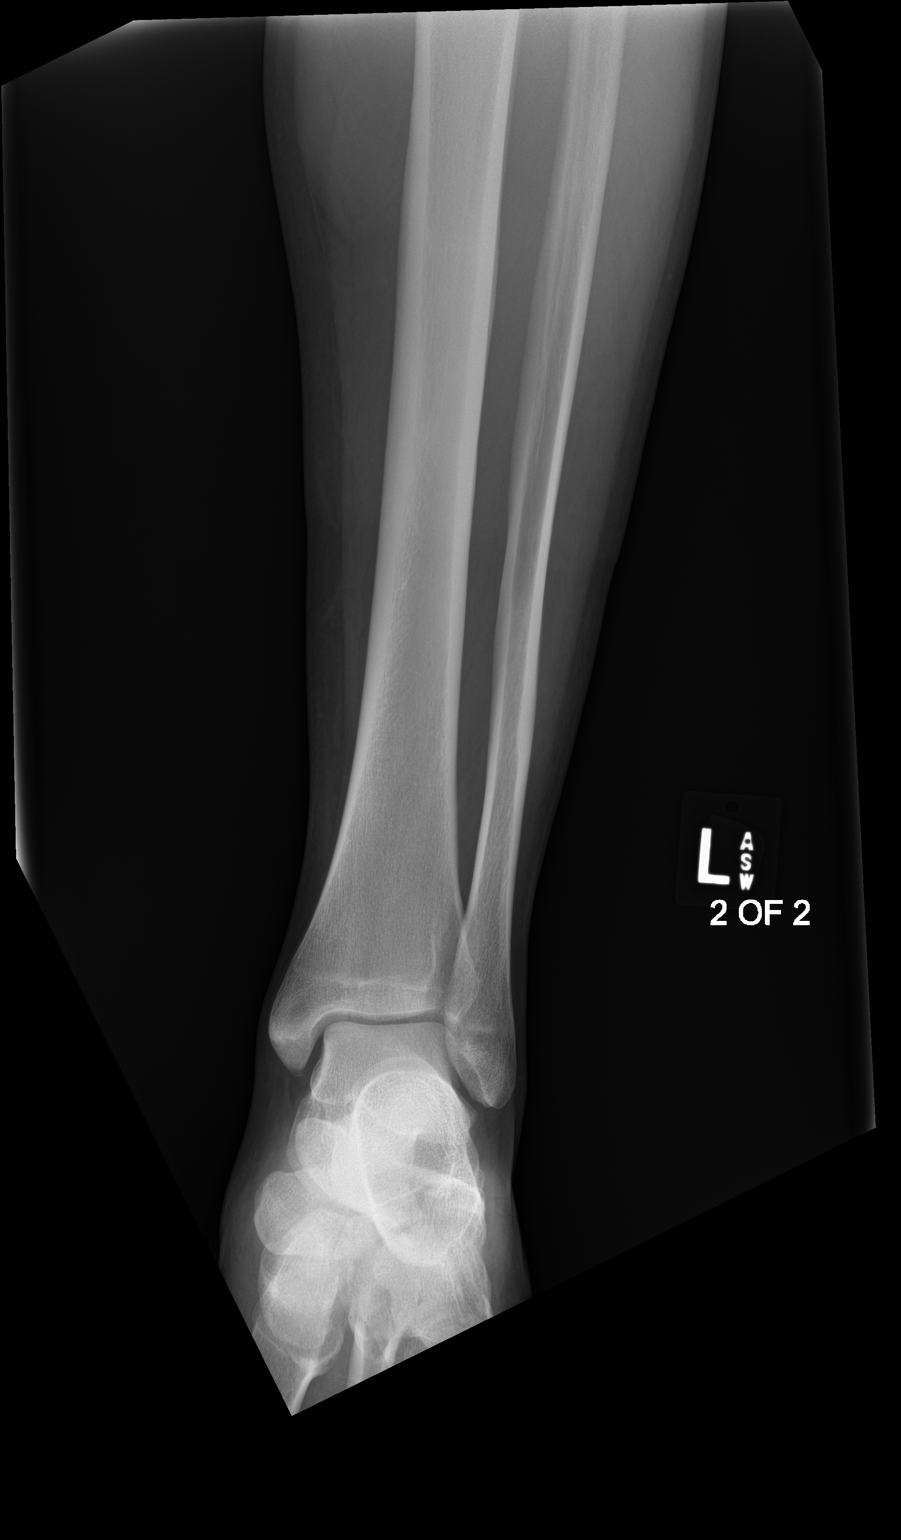

[tibia lat (1 of 2)]
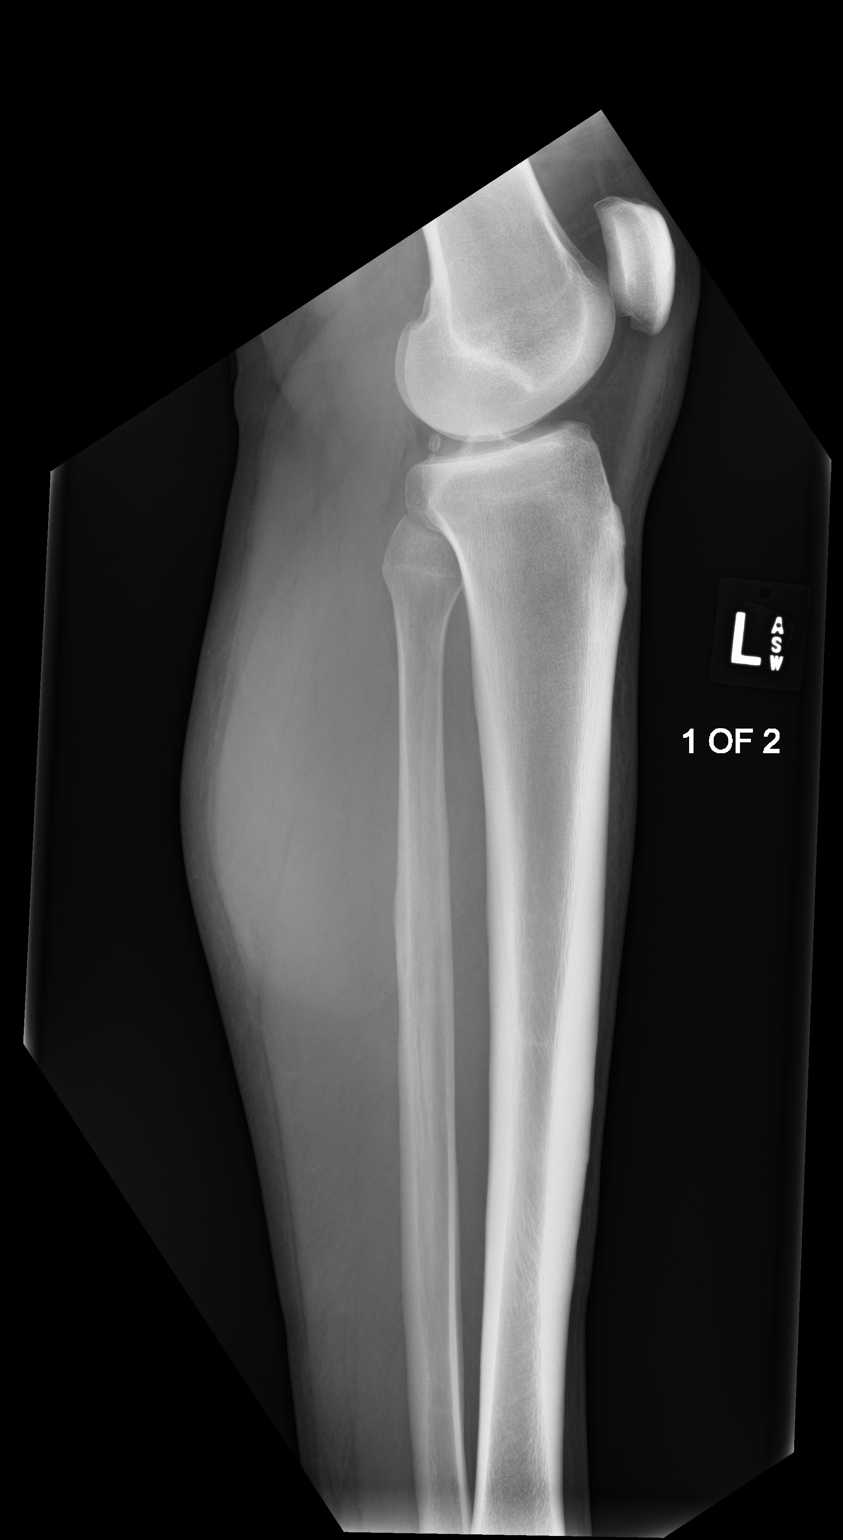

[tibia lat (2 of 2)]
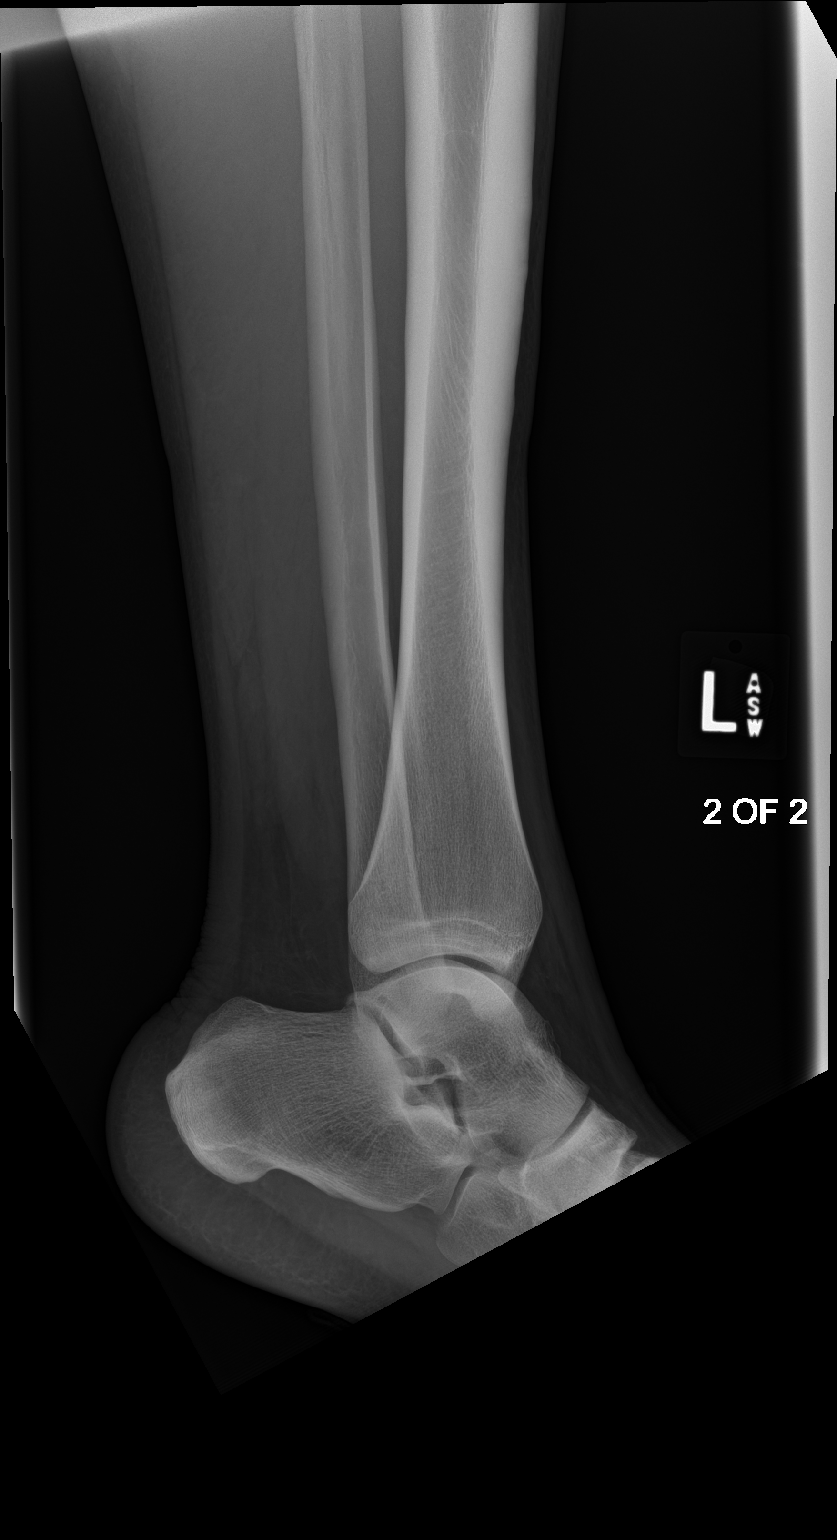

[4 of 4 positions shown; findings below may reference images not displayed]

FINDINGS: There is no evidence of fracture or other focal bone lesions. Soft
tissues are unremarkable.
IMPRESSION: Negative.
